# Patient Record
Sex: Female | Born: 1970 | Hispanic: No | Marital: Married | Smoking: Never smoker
Health system: Southern US, Community
[De-identification: ages and names within clinical notes are randomized; demographics above are authoritative.]

## PROBLEM LIST (undated history)

## (undated) DIAGNOSIS — R011 Cardiac murmur, unspecified: Secondary | ICD-10-CM

## (undated) DIAGNOSIS — R42 Dizziness and giddiness: Secondary | ICD-10-CM

## (undated) DIAGNOSIS — I10 Essential (primary) hypertension: Secondary | ICD-10-CM

## (undated) DIAGNOSIS — E785 Hyperlipidemia, unspecified: Secondary | ICD-10-CM

## (undated) HISTORY — DX: Cardiac murmur, unspecified: R01.1

---

## 2020-02-28 ENCOUNTER — Emergency Department (HOSPITAL_COMMUNITY)
Admission: EM | Admit: 2020-02-28 | Discharge: 2020-02-28 | Disposition: A | Discharge status: Home / Self Care | Payer: No Typology Code available for payment source | Attending: Emergency Medicine | Admitting: Emergency Medicine

## 2020-02-28 ENCOUNTER — Other Ambulatory Visit: Payer: Self-pay

## 2020-02-28 ENCOUNTER — Encounter (HOSPITAL_COMMUNITY): Payer: Self-pay | Admitting: Emergency Medicine

## 2020-02-28 ENCOUNTER — Emergency Department (HOSPITAL_COMMUNITY): Payer: No Typology Code available for payment source

## 2020-02-28 DIAGNOSIS — R519 Headache, unspecified: Secondary | ICD-10-CM | POA: Insufficient documentation

## 2020-02-28 DIAGNOSIS — M542 Cervicalgia: Secondary | ICD-10-CM | POA: Insufficient documentation

## 2020-02-28 DIAGNOSIS — R079 Chest pain, unspecified: Secondary | ICD-10-CM | POA: Diagnosis not present

## 2020-02-28 MED ORDER — CYCLOBENZAPRINE HCL 10 MG PO TABS: 10.0000 mg | ORAL_TABLET | Freq: Two times a day (BID) | ORAL | 0 refills | Status: DC | PRN

## 2020-02-28 MED ORDER — OXYCODONE-ACETAMINOPHEN 5-325 MG PO TABS
1.0000 | ORAL_TABLET | Freq: Once | ORAL | Status: AC
  Administered 2020-02-28: 1 via ORAL
  Filled 2020-02-28: qty 1

## 2020-02-28 MED ORDER — IBUPROFEN 600 MG PO TABS: 600.0000 mg | ORAL_TABLET | Freq: Four times a day (QID) | ORAL | 0 refills | Status: DC | PRN
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
## 2020-02-28 NOTE — ED Notes (Signed)
An After Visit Summary was printed and given to the patient. Discharge instructions given and no further questions at this time.  
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
## 2020-03-10 ENCOUNTER — Other Ambulatory Visit: Payer: Self-pay

## 2020-03-11 ENCOUNTER — Ambulatory Visit (INDEPENDENT_AMBULATORY_CARE_PROVIDER_SITE_OTHER): Payer: No Typology Code available for payment source | Admitting: Nurse Practitioner

## 2020-03-11 ENCOUNTER — Encounter: Payer: Self-pay | Admitting: Nurse Practitioner

## 2020-03-11 ENCOUNTER — Encounter (HOSPITAL_COMMUNITY): Payer: Self-pay | Admitting: Emergency Medicine

## 2020-03-11 VITALS — BP 120/82 | HR 100 | Temp 97.2°F | Ht 59.0 in | Wt 99.0 lb

## 2020-03-11 DIAGNOSIS — M545 Low back pain, unspecified: Secondary | ICD-10-CM

## 2020-03-11 DIAGNOSIS — F0781 Postconcussional syndrome: Secondary | ICD-10-CM

## 2020-03-11 NOTE — Patient Instructions (Addendum)
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   Post-Concussion Syndrome  Post-concussion syndrome is when symptoms last longer than normal after a head injury. What are the signs or symptoms? After a head injury, you may:  Have headaches.  Feel tired.  Feel dizzy.  Feel weak.  Have trouble seeing.  Have trouble in bright lights.  Have trouble hearing.  Not be able to remember things.  Not be able to focus.  Have trouble sleeping.  Have mood swings.  Have trouble learning new things. These can last from weeks to months. Follow these instructions at home: Medicines  Take all medicines only as told by your doctor.  Do not take prescription pain medicines. Activity  Limit activities as told by your doctor. This includes: ? Homework. ? Job-related work. ? Thinking. ? Watching TV. ? Using a computer or phone. ? Puzzles. ? Exercise. ? Sports.  Slowly return to your normal activity as told by your doctor.  Stop an activity if you have symptoms.  Do not do anything that may cause you to get injured again. General instructions  Rest. Try to: ? Sleep 7-9 hours each night. ? Take naps or breaks when you feel tired during the day.  Do not drink alcohol until your doctor says that you can.  Keep track of your symptoms.  Keep all follow-up visits as told by your doctor. This is important. Contact a doctor if:  You do not improve.  You get worse.  You have another injury. Get help right away if:  You have a very bad headache.  You feel confused.  You feel very sleepy.  You pass out (faint).  You throw up (vomit).  You feel weak in any part of your body.  You feel numb in any part of your body.  You start shaking (have a seizure).  You have trouble talking. Summary  Post-concussion syndrome is when symptoms last  longer than normal after a head injury.  Limit all activity after your injury. Gradually return to normal activity as told by your doctor.  Rest, do not drink alcohol, and avoid prescription pain medicines after a concussion.  Call your doctor if your symptoms get worse. This information is not intended to replace advice given to you by your health care provider. Make sure you discuss any questions you have with your health care provider. Document Revised: 03/09/2018 Document Reviewed: 06/21/2017 Elsevier Patient Education  2020 [employer]  Acute Back Pain, Adult Acute back pain is sudden and usually short-lived. It is often caused by an injury to the muscles and tissues in the back. The injury may result from:  A muscle or ligament getting overstretched or torn (strained). Ligaments are tissues that connect bones to each other. Lifting something improperly can cause a back strain.  Wear and tear (degeneration) of the spinal disks. Spinal disks are circular tissue that provides cushioning between the bones of the spine (vertebrae).  Twisting motions, such as while playing sports or doing yard work.  A hit to the back.  Arthritis. You may have a physical exam, lab tests, and imaging tests to find the cause of your pain. Acute back pain usually goes away with rest and home care. Follow these instructions at home: Managing pain, stiffness, and swelling  Take over-the-counter and prescription medicines only as told by your health care provider.  Your health  care provider may recommend applying ice during the first 24-48 hours after your pain starts. To do this: ? Put ice in a plastic bag. ? Place a towel between your skin and the bag. ? Leave the ice on for 20 minutes, 2-3 times a day.  If directed, apply heat to the affected area as often as told by your health care provider. Use the heat source that your health care provider recommends, such as a moist heat pack or a heating  pad. ? Place a towel between your skin and the heat source. ? Leave the heat on for 20-30 minutes. ? Remove the heat if your skin turns bright red. This is especially important if you are unable to feel pain, heat, or cold. You have a greater risk of getting burned. Activity   Do not stay in bed. Staying in bed for more than 1-2 days can delay your recovery.  Sit up and stand up straight. Avoid leaning forward when you sit, or hunching over when you stand. ? If you work at a desk, sit close to it so you do not need to lean over. Keep your chin tucked in. Keep your neck drawn back, and keep your elbows bent at a right angle. Your arms should look like the letter "L." ? Sit high and close to the steering wheel when you drive. Add lower back (lumbar) support to your car seat, if needed.  Take short walks on even surfaces as soon as you are able. Try to increase the length of time you walk each day.  Do not sit, drive, or stand in one place for more than 30 minutes at a time. Sitting or standing for long periods of time can put stress on your back.  Do not drive or use heavy machinery while taking prescription pain medicine.  Use proper lifting techniques. When you bend and lift, use positions that put less stress on your back: ? [place] your knees. ? Keep the load close to your body. ? Avoid twisting.  Exercise regularly as told by your health care provider. Exercising helps your back heal faster and helps prevent back injuries by keeping muscles strong and flexible.  Work with a physical therapist to make a safe exercise program, as recommended by your health care provider. Do any exercises as told by your physical therapist. Lifestyle  Maintain a healthy weight. Extra weight puts stress on your back and makes it difficult to have good posture.  Avoid activities or situations that make you feel anxious or stressed. Stress and anxiety increase muscle tension and can make back pain worse.  Learn ways to manage anxiety and stress, such as through exercise. General instructions  Sleep on a firm mattress in a comfortable position. Try lying on your side with your knees slightly bent. If you lie on your back, put a pillow under your knees.  Follow your treatment plan as told by your health care provider. This may include: ? Cognitive or behavioral therapy. ? Acupuncture or massage therapy. ? Meditation or yoga. Contact a health care provider if:  You have pain that is not relieved with rest or medicine.  You have increasing pain going down into your legs or buttocks.  Your pain does not improve after 2 weeks.  You have pain at night.  You lose weight without trying.  You have a fever or chills. Get help right away if:  You develop new bowel or bladder control problems.  You have unusual weakness  or numbness in your arms or legs.  You develop nausea or vomiting.  You develop abdominal pain.  You feel faint. Summary  Acute back pain is sudden and usually short-lived.  Use proper lifting techniques. When you bend and lift, use positions that put less stress on your back.  Take over-the-counter and prescription medicines and apply heat or ice as directed by your health care provider. This information is not intended to replace advice given to you by your health care provider. Make sure you discuss any questions you have with your health care provider. Document Revised: 09/05/2018 Document Reviewed: 12/29/2016 Elsevier Patient Education  2020 [employer].
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
## 2020-03-20 ENCOUNTER — Inpatient Hospital Stay: Payer: No Typology Code available for payment source | Admitting: Physician Assistant

## 2020-04-14 ENCOUNTER — Other Ambulatory Visit: Payer: Self-pay

## 2020-04-14 ENCOUNTER — Ambulatory Visit (INDEPENDENT_AMBULATORY_CARE_PROVIDER_SITE_OTHER): Payer: No Typology Code available for payment source

## 2020-04-14 ENCOUNTER — Ambulatory Visit (INDEPENDENT_AMBULATORY_CARE_PROVIDER_SITE_OTHER): Payer: No Typology Code available for payment source | Admitting: Nurse Practitioner

## 2020-04-14 ENCOUNTER — Encounter: Payer: Self-pay | Admitting: Nurse Practitioner

## 2020-04-14 VITALS — BP 138/82 | HR 82 | Temp 96.0°F | Ht 58.5 in | Wt 99.6 lb

## 2020-04-14 DIAGNOSIS — Z0000 Encounter for general adult medical examination without abnormal findings: Secondary | ICD-10-CM

## 2020-04-14 DIAGNOSIS — M545 Low back pain, unspecified: Secondary | ICD-10-CM

## 2020-04-14 DIAGNOSIS — Z136 Encounter for screening for cardiovascular disorders: Secondary | ICD-10-CM

## 2020-04-14 DIAGNOSIS — Z13220 Encounter for screening for lipoid disorders: Secondary | ICD-10-CM

## 2020-04-14 DIAGNOSIS — Z1231 Encounter for screening mammogram for malignant neoplasm of breast: Secondary | ICD-10-CM | POA: Diagnosis not present

## 2020-04-14 DIAGNOSIS — Z0001 Encounter for general adult medical examination with abnormal findings: Secondary | ICD-10-CM

## 2020-04-14 LAB — CBC WITH DIFFERENTIAL/PLATELET
Basophils Absolute: 0.1 10*3/uL (ref 0.0–0.1)
Basophils Relative: 1 % (ref 0.0–3.0)
Eosinophils Absolute: 0.1 10*3/uL (ref 0.0–0.7)
Eosinophils Relative: 1.4 % (ref 0.0–5.0)
HCT: 42.8 % (ref 36.0–46.0)
Hemoglobin: 14.1 g/dL (ref 12.0–15.0)
Lymphocytes Relative: 31.3 % (ref 12.0–46.0)
Lymphs Abs: 2.4 10*3/uL (ref 0.7–4.0)
MCHC: 32.9 g/dL (ref 30.0–36.0)
MCV: 82.7 fl (ref 78.0–100.0)
Monocytes Absolute: 0.5 10*3/uL (ref 0.1–1.0)
Monocytes Relative: 7.2 % (ref 3.0–12.0)
Neutro Abs: 4.4 10*3/uL (ref 1.4–7.7)
Neutrophils Relative %: 59.1 % (ref 43.0–77.0)
Platelets: 454 10*3/uL — ABNORMAL HIGH (ref 150.0–400.0)
RBC: 5.18 Mil/uL — ABNORMAL HIGH (ref 3.87–5.11)
RDW: 12.9 % (ref 11.5–15.5)
WBC: 7.5 10*3/uL (ref 4.0–10.5)

## 2020-04-14 LAB — COMPREHENSIVE METABOLIC PANEL
ALT: 14 U/L (ref 0–35)
AST: 17 U/L (ref 0–37)
Albumin: 4.5 g/dL (ref 3.5–5.2)
Alkaline Phosphatase: 76 U/L (ref 39–117)
BUN: 10 mg/dL (ref 6–23)
CO2: 32 mEq/L (ref 19–32)
Calcium: 9.5 mg/dL (ref 8.4–10.5)
Chloride: 102 mEq/L (ref 96–112)
Creatinine, Ser: 0.48 mg/dL (ref 0.40–1.20)
GFR: 112.59 mL/min (ref >60.00)
Glucose, Bld: 88 mg/dL (ref 70–99)
Potassium: 4.5 mEq/L (ref 3.5–5.1)
Sodium: 140 mEq/L (ref 135–145)
Total Bilirubin: 0.8 mg/dL (ref 0.2–1.2)
Total Protein: 7.6 g/dL (ref 6.0–8.3)

## 2020-04-14 LAB — LIPID PANEL
Cholesterol: 184 mg/dL (ref 0–200)
HDL: 60.7 mg/dL (ref >39.00)
LDL Cholesterol: 104 mg/dL — ABNORMAL HIGH (ref 0–99)
NonHDL: 123.77
Total CHOL/HDL Ratio: 3
Triglycerides: 98 mg/dL (ref 0.0–149.0)
VLDL: 19.6 mg/dL (ref 0.0–40.0)

## 2020-04-14 NOTE — Patient Instructions (Addendum)
Go to lab for blood draw and back x-ray  You will be contacted to schedule appt for mammogram  Health Maintenance, Female Adopting a healthy lifestyle and getting preventive care are important in promoting health and wellness. Ask your health care provider about:  The right schedule for you to have regular tests and exams.  Things you can do on your own to prevent diseases and keep yourself healthy. What should I know about diet, weight, and exercise? Eat a healthy diet   Eat a diet that includes plenty of vegetables, fruits, low-fat dairy products, and lean protein.  Do not eat a lot of foods that are high in solid fats, added sugars, or sodium. Maintain a healthy weight Body mass index (BMI) is used to identify weight problems. It estimates body fat based on height and weight. Your health care provider can help determine your BMI and help you achieve or maintain a healthy weight. Get regular exercise Get regular exercise. This is one of the most important things you can do for your health. Most adults should:  Exercise for at least 150 minutes each week. The exercise should increase your heart rate and make you sweat (moderate-intensity exercise).  Do strengthening exercises at least twice a week. This is in addition to the moderate-intensity exercise.  Spend less time sitting. Even light physical activity can be beneficial. Watch cholesterol and blood lipids Have your blood tested for lipids and cholesterol at 47 years of age, then have this test every 5 years. Have your cholesterol levels checked more often if:  Your lipid or cholesterol levels are high.  You are older than 47 years of age.  You are at high risk for heart disease. What should I know about cancer screening? Depending on your health history and family history, you may need to have cancer screening at various ages. This may include screening for:  Breast cancer.  Cervical cancer.  Colorectal  cancer.  Skin cancer.  Lung cancer. What should I know about heart disease, diabetes, and high blood pressure? Blood pressure and heart disease  High blood pressure causes heart disease and increases the risk of stroke. This is more likely to develop in people who have high blood pressure readings, are of African descent, or are overweight.  Have your blood pressure checked: ? Every 3-5 years if you are 14-23 years of age. ? Every year if you are 35 years old or older. Diabetes Have regular diabetes screenings. This checks your fasting blood sugar level. Have the screening done:  Once every three years after age 33 if you are at a normal weight and have a low risk for diabetes.  More often and at a younger age if you are overweight or have a high risk for diabetes. What should I know about preventing infection? Hepatitis B If you have a higher risk for hepatitis B, you should be screened for this virus. Talk with your health care provider to find out if you are at risk for hepatitis B infection. Hepatitis C Testing is recommended for:  Everyone born from [birth year] through 1963.  Anyone with known risk factors for hepatitis C. Sexually transmitted infections (STIs)  Get screened for STIs, including gonorrhea and chlamydia, if: ? You are sexually active and are younger than 47 years of age. ? You are older than 47 years of age and your health care provider tells you that you are at risk for this type of infection. ? Your sexual activity has changed since  you were last screened, and you are at increased risk for chlamydia or gonorrhea. Ask your health care provider if you are at risk.  Ask your health care provider about whether you are at high risk for HIV. Your health care provider may recommend a prescription medicine to help prevent HIV infection. If you choose to take medicine to prevent HIV, you should first get tested for HIV. You should then be tested every 3 months for as long as  you are taking the medicine. Pregnancy  If you are about to stop having your period (premenopausal) and you may become pregnant, seek counseling before you get pregnant.  Take 400 to 800 micrograms (mcg) of folic acid every day if you become pregnant.  Ask for birth control (contraception) if you want to prevent pregnancy. Osteoporosis and menopause Osteoporosis is a disease in which the bones lose minerals and strength with aging. This can result in bone fractures. If you are 47 years old or older, or if you are at risk for osteoporosis and fractures, ask your health care provider if you should:  Be screened for bone loss.  Take a calcium or vitamin D supplement to lower your risk of fractures.  Be given hormone replacement therapy (HRT) to treat symptoms of menopause. Follow these instructions at home: Lifestyle  Do not use any products that contain nicotine or tobacco, such as cigarettes, e-cigarettes, and chewing tobacco. If you need help quitting, ask your health care provider.  Do not use street drugs.  Do not share needles.  Ask your health care provider for help if you need support or information about quitting drugs. Alcohol use  Do not drink alcohol if: ? Your health care provider tells you not to drink. ? You are pregnant, may be pregnant, or are planning to become pregnant.  If you drink alcohol: ? Limit how much you use to 0-1 drink a day. ? Limit intake if you are breastfeeding.  Be aware of how much alcohol is in your drink. In the [place] one drink equals one 12 oz bottle of beer (355 mL), one 5 oz glass of wine (148 mL), or one 1 oz glass of hard liquor (44 mL). General instructions  Schedule regular health, dental, and eye exams.  Stay current with your vaccines.  Tell your health care provider if: ? You often feel depressed. ? You have ever been abused or do not feel safe at home. Summary  Adopting a healthy lifestyle and getting preventive care are  important in promoting health and wellness.  Follow your health care provider's instructions about healthy diet, exercising, and getting tested or screened for diseases.  Follow your health care provider's instructions on monitoring your cholesterol and blood pressure. This information is not intended to replace advice given to you by your health care provider. Make sure you discuss any questions you have with your health care provider. Document Revised: 05/10/2018 Document Reviewed: 05/10/2018 Elsevier Patient Education  2020 [employer].
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
## 2020-06-17 ENCOUNTER — Encounter (HOSPITAL_COMMUNITY): Payer: Self-pay

## 2020-06-17 ENCOUNTER — Other Ambulatory Visit: Payer: Self-pay

## 2020-06-17 DIAGNOSIS — W01198A Fall on same level from slipping, tripping and stumbling with subsequent striking against other object, initial encounter: Secondary | ICD-10-CM | POA: Diagnosis not present

## 2020-06-17 DIAGNOSIS — S0990XA Unspecified injury of head, initial encounter: Secondary | ICD-10-CM | POA: Diagnosis present
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
## 2020-06-18 ENCOUNTER — Emergency Department (HOSPITAL_COMMUNITY)
Admission: EM | Admit: 2020-06-18 | Discharge: 2020-06-18 | Disposition: A | Discharge status: Home / Self Care | Payer: No Typology Code available for payment source | Attending: Emergency Medicine | Admitting: Emergency Medicine

## 2020-06-18 DIAGNOSIS — W19XXXA Unspecified fall, initial encounter: Secondary | ICD-10-CM

## 2020-06-18 DIAGNOSIS — S0990XA Unspecified injury of head, initial encounter: Secondary | ICD-10-CM

## 2020-06-18 MED ORDER — ACETAMINOPHEN 500 MG PO TABS
1000.0000 mg | ORAL_TABLET | Freq: Once | ORAL | Status: AC
  Administered 2020-06-18: 1000 mg via ORAL
  Filled 2020-06-18: qty 2

## 2020-06-18 MED ORDER — IBUPROFEN 600 MG PO TABS: 600.0000 mg | ORAL_TABLET | Freq: Four times a day (QID) | ORAL | 0 refills | Status: AC | PRN

## 2020-06-18 MED ORDER — CYCLOBENZAPRINE HCL 10 MG PO TABS: 10.0000 mg | ORAL_TABLET | Freq: Two times a day (BID) | ORAL | 0 refills | Status: DC | PRN

## 2020-06-18 NOTE — ED Notes (Signed)
Verbalized understanding discharge instructions, prescriptions, and follow-up. In no acute distress.   

## 2020-06-18 NOTE — ED Provider Notes (Signed)
[facility]-EMERGENCY DEPT Provider Note   CSN: [id number] Arrival date & time: 06/17/20  1918     History Chief Complaint  Patient presents with  . Fall    Mia King is a 50 y.o. female.  The history is provided by the patient. No language interpreter was used.  Fall     50 year old female presenting to ED for evaluation of a recent fall.  Patient [place], slipped on ice fell backwards and struck her head.  She denies any loss of consciousness but states she was on the ground for several minutes requiring help to get up.  She complained of sharp pain to the back of her head, moderate in severity nonradiating worse with palpation.  She also endorsed neck pain as well.  She does not complain of confusion, nausea, vomiting, light sensitivity, focal numbness or focal weakness.  She denies any precipitating symptoms prior to the fall.  She is not on any blood thinner medication.  History reviewed. No pertinent past medical history.  Patient Active Problem List   Diagnosis Date Noted  . Acute bilateral low back pain without sciatica 04/14/2020    History reviewed. No pertinent surgical history.   OB History   No obstetric history on file.     No family history on file.  Social History   Tobacco Use  . Smoking status: Never Smoker  . Smokeless tobacco: Never Used  Vaping Use  . Vaping Use: Never used  Substance Use Topics  . Alcohol use: Never  . Drug use: Never    Home Medications Prior to Admission medications   Medication Sig Start Date End Date Taking? Authorizing Provider  cyclobenzaprine (FLEXERIL) 10 MG tablet Take 1 tablet (10 mg total) by mouth 2 (two) times daily as needed for muscle spasms. 02/28/20   Sponseller, Lupe Carney R, PA-C  ibuprofen (ADVIL) 600 MG tablet Take 1 tablet (600 mg total) by mouth every 6 (six) hours as needed for mild pain. 02/28/20   Pollyann Savoy, MD     Allergies    Eggs or egg-derived products and Other  Review of Systems   Review of Systems  All other systems reviewed and are negative.   Physical Exam Updated Vital Signs BP (!) 122/52 (BP Location: Left Arm)   Pulse 76   Temp 98.1 F (36.7 C) (Oral)   Resp 16   Ht 4\' 11"  (1.499 m)   Wt 40.8 kg   LMP 06/10/2020   SpO2 97%   BMI 18.18 kg/m   Physical Exam Vitals and nursing note reviewed.  Constitutional:      General: She is not in acute distress.    Appearance: She is well-developed and well-nourished.     Comments: Patient sitting comfortably in the chair appears to be in no acute discomfort  HENT:     Head: Normocephalic and atraumatic.     Comments: Mild tenderness to the occipital scalp without any ecchymosis laceration or overlying skin changes.  No hemotympanum no septal hematoma no malocclusion no midface tenderness no battle signs or raccoon's eyes. Eyes:     Extraocular Movements: Extraocular movements intact.     Conjunctiva/sclera: Conjunctivae normal.     Pupils: Pupils are equal, round, and reactive to light.  Musculoskeletal:     Cervical back: Normal range of motion and neck supple. Tenderness (Mild tenderness to cervical and paracervical spinal muscle with normal range of motion.) present. No rigidity.  Skin:    Findings: No rash.  Neurological:     Mental Status: She is alert and oriented to person, place, and time.     GCS: GCS eye subscore is 4. GCS verbal subscore is 5. GCS motor subscore is 6.     Cranial Nerves: Cranial nerves are intact.     Sensory: Sensation is intact.     Motor: Motor function is intact.     Gait: Gait is intact.  Psychiatric:        Mood and Affect: Mood and affect and mood normal.     ED Results / Procedures / Treatments   Labs (all labs ordered are listed, but only abnormal results are displayed) Labs Reviewed - No data to display  EKG None  Radiology No results found.  Procedures Procedures  (including critical care time)  Medications Ordered in ED Medications  acetaminophen (TYLENOL) tablet 1,000 mg (has no administration in time range)    ED Course  I have reviewed the triage vital signs and the nursing notes.  Pertinent labs & imaging results that were available during my care of the patient were reviewed by me and considered in my medical decision making (see chart for details).    MDM Rules/Calculators/A&P                          BP (!) 122/52 (BP Location: Left Arm)   Pulse 76   Temp 98.1 F (36.7 C) (Oral)   Resp 16   Ht 4\' 11"  (1.499 m)   Wt 40.8 kg   LMP 06/10/2020   SpO2 97%   BMI 18.18 kg/m   Final Clinical Impression(s) / ED Diagnoses Final diagnoses:  Minor head injury, initial encounter  Fall, initial encounter    Rx / DC Orders ED Discharge Orders         Ordered    cyclobenzaprine (FLEXERIL) 10 MG tablet  2 times daily PRN        06/18/20 1010    ibuprofen (ADVIL) 600 MG tablet  Every 6 hours PRN        06/18/20 1010         10:05 AM Patient had a mechanical fall yesterday when she slipped on ice and struck her head.  Minor head injury without obvious signs of trauma on exam.  She is mentating appropriately, normal gait on ambulation and without any focal neurodeficit.  Through shared decision making, we opted not to have advanced imaging as it is no acute.  Will provide symptomatic treatment and give return precaution.  Work note provided as requested.   06/20/20, PA-C 06/18/20 1012    06/20/20, MD 06/18/20 [id number]

## 2020-06-18 NOTE — Discharge Instructions (Signed)
You have been evaluated for your recent fall.  Fortunately no obvious significant sign of head trauma on exam.  Take ibuprofen and/or flexeril as needed for your symptoms.  If you develop nausea/vomiting , worsening headache, or if you have any concerns please do not hesitate to return to the ER for reevaluation.

## 2021-05-27 ENCOUNTER — Ambulatory Visit: Payer: Self-pay | Admitting: Cardiology

## 2021-05-27 DIAGNOSIS — R011 Cardiac murmur, unspecified: Secondary | ICD-10-CM | POA: Insufficient documentation

## 2021-05-27 NOTE — Progress Notes (Signed)
No show

## 2021-09-10 ENCOUNTER — Encounter (HOSPITAL_COMMUNITY): Payer: Self-pay

## 2021-09-10 ENCOUNTER — Ambulatory Visit (HOSPITAL_COMMUNITY)
Admission: EM | Admit: 2021-09-10 | Discharge: 2021-09-10 | Disposition: A | Discharge status: Home / Self Care | Payer: No Typology Code available for payment source | Attending: Emergency Medicine | Admitting: Emergency Medicine

## 2021-09-10 DIAGNOSIS — R42 Dizziness and giddiness: Secondary | ICD-10-CM

## 2021-09-10 DIAGNOSIS — R110 Nausea: Secondary | ICD-10-CM

## 2021-09-10 MED ORDER — ONDANSETRON HCL 4 MG PO TABS: 4.0000 mg | ORAL_TABLET | Freq: Three times a day (TID) | ORAL | 0 refills | Status: DC | PRN

## 2021-09-10 MED ORDER — ONDANSETRON HCL 4 MG/2ML IJ SOLN
4.0000 mg | Freq: Once | INTRAMUSCULAR | Status: AC
  Administered 2021-09-10: 4 mg via INTRAMUSCULAR

## 2021-09-10 MED ORDER — ONDANSETRON HCL 4 MG/2ML IJ SOLN
INTRAMUSCULAR | Status: AC
  Filled 2021-09-10: qty 2

## 2021-09-10 MED ORDER — MECLIZINE HCL 25 MG PO TABS: 25.0000 mg | ORAL_TABLET | Freq: Three times a day (TID) | ORAL | 0 refills | Status: DC | PRN

## 2021-09-10 MED ORDER — MECLIZINE HCL 25 MG PO TABS: 25.0000 mg | ORAL_TABLET | Freq: Once | ORAL | Status: DC

## 2021-09-10 NOTE — Discharge Instructions (Addendum)
Your symptoms and physical exam are consistent with vertigo.  Take meclizine and antinausea medication as needed 3 times daily.  Return for new or worsening symptoms at any time for reevaluation.  It may take 24 hours for your symptoms to improve. ?

## 2021-09-10 NOTE — ED Provider Notes (Addendum)
?[facility] ? ? ?MRN: [id number] DOB: [birth year] ? ?Subjective:  ? ?Chief Complaint;  ?Chief Complaint  ?Patient presents with  ? Chest Pain  ? Dizziness  ?Pt presents today with chest pain, dizziness, and vomiting that started this morning. Pt describes pressure in chest pain. Pt denies chest pain radiating anywhere else.Pt states difficulty breathing.  ? ?Baylei Siebels is a 51 y.o. female presenting for severe dizziness that started this morning which she describes as a spinning sensation causing secondary dizziness.  She also describes intermittent pain in her chest.  She has no significant risk factors for CAD and no family history. ? ? ?Current Facility-Administered Medications:  ?  meclizine (ANTIVERT) tablet 25 mg, 25 mg, Oral, Once, Dewaine Conger L, NP ?  ondansetron (ZOFRAN) injection 4 mg, 4 mg, Intramuscular, Once, Jone Baseman, NP ? ?Current Outpatient Medications:  ?  meclizine (ANTIVERT) 25 MG tablet, Take 1 tablet (25 mg total) by mouth 3 (three) times daily as needed for dizziness., Disp: 30 tablet, Rfl: 0 ?  ondansetron (ZOFRAN) 4 MG tablet, Take 1 tablet (4 mg total) by mouth every 8 (eight) hours as needed for nausea or vomiting., Disp: 4 tablet, Rfl: 0 ?  cyclobenzaprine (FLEXERIL) 10 MG tablet, Take 1 tablet (10 mg total) by mouth 2 (two) times daily as needed for muscle spasms., Disp: 15 tablet, Rfl: 0 ?  ibuprofen (ADVIL) 600 MG tablet, Take 1 tablet (600 mg total) by mouth every 6 (six) hours as needed for mild pain or moderate pain., Disp: 20 tablet, Rfl: 0  ? ?Allergies  ?Allergen Reactions  ? Eggs Or Egg-Derived Products Hives  ? Other Hives  ?  Chicken and Seafood  ? ? ?History reviewed. No pertinent past medical history.  ? ?Review of Systems  ?All other systems reviewed and are negative. ? ? ?Objective:  ? ?Vitals: ?BP 118/74 (BP Location: Right Arm)   Pulse 97   Temp 98 ?F (36.7 ?C) (Oral)   Resp 18   LMP 08/10/2021   SpO2 98%  ? ?Physical Exam ?Vitals and  nursing note reviewed.  ?Constitutional:   ?   General: She is in acute distress.  ?   Appearance: Normal appearance. She is not ill-appearing or diaphoretic.  ?   Comments: Patient appears uncomfortable she is slouched over in a wheelchair with her eyes closed.  ?HENT:  ?   Head: Atraumatic.  ?   Right Ear: External ear normal.  ?   Left Ear: External ear normal.  ?   Nose: Nose normal.  ?   Mouth/Throat:  ?   Mouth: Mucous membranes are moist.  ?Eyes:  ?   General: No scleral icterus.    ?   Right eye: No discharge.     ?   Left eye: No discharge.  ?   Extraocular Movements: Extraocular movements intact.  ?   Conjunctiva/sclera: Conjunctivae normal.  ?   Pupils: Pupils are equal, round, and reactive to light.  ?   Comments: Mild nystagmus on EOM unidirectional horizontal  ?Cardiovascular:  ?   Rate and Rhythm: Normal rate and regular rhythm.  ?Pulmonary:  ?   Effort: Pulmonary effort is normal.  ?   Breath sounds: Normal breath sounds.  ?Abdominal:  ?   General: There is no distension.  ?   Palpations: Abdomen is soft.  ?   Tenderness: There is no abdominal tenderness. There is no right CVA tenderness, left CVA tenderness, guarding or  rebound.  ?Skin: ?   General: Skin is warm and dry.  ?   Capillary Refill: Capillary refill takes less than 2 seconds.  ?Neurological:  ?   General: No focal deficit present.  ?   Mental Status: She is alert and oriented to person, place, and time.  ?Psychiatric:     ?   Mood and Affect: Mood normal.  ? ? ?No results found for this or any previous visit (from the past 24 hour(s)). ? ?No results found.  ?  ? ?Assessment and Plan :  ? ?1. Vertigo   ? ? ?Meds ordered this encounter  ?Medications  ? meclizine (ANTIVERT) tablet 25 mg  ? ondansetron (ZOFRAN) injection 4 mg  ? meclizine (ANTIVERT) 25 MG tablet  ?  Sig: Take 1 tablet (25 mg total) by mouth 3 (three) times daily as needed for dizziness.  ?  Dispense:  30 tablet  ?  Refill:  0  ? ondansetron (ZOFRAN) 4 MG tablet  ?  Sig: Take  1 tablet (4 mg total) by mouth every 8 (eight) hours as needed for nausea or vomiting.  ?  Dispense:  4 tablet  ?  Refill:  0  ? ? ?MDM:  ?Shonya Sumida is a 51 y.o. female presenting for spinning dizziness that started this morning was secondary nausea and a few bouts of retching without significant vomiting.  Her clinical exam and vital signs are unremarkable, no neurologic deficits.  Patient is started here in the clinic on meclizine and Zofran she is encouraged to rest take her medication and return at any time if new or worsening symptoms ? ?Dewaine Conger FNP-C MCN  ?  ?Jone Baseman, NP ?09/10/21 [phone] ? ?Pt EKG RSR without st abnormalities- CP was reproduced on AP and lateral wall compression ?  ?Jone Baseman, NP ?09/10/21 [phone] ? ?

## 2021-09-10 NOTE — ED Triage Notes (Signed)
Pt presents today with chest pain, dizziness, and vomiting that started this morning. Pt describes pressure in chest pain. Pt denies chest pain radiating anywhere else.Pt states difficulty breathing.  ?

## 2022-04-13 ENCOUNTER — Encounter: Payer: Self-pay | Admitting: Internal Medicine

## 2022-04-13 ENCOUNTER — Ambulatory Visit: Payer: No Typology Code available for payment source | Admitting: Internal Medicine

## 2022-04-13 VITALS — BP 132/82 | HR 91 | Resp 16 | Ht 59.0 in | Wt 99.0 lb

## 2022-04-13 DIAGNOSIS — I517 Cardiomegaly: Secondary | ICD-10-CM

## 2022-04-13 DIAGNOSIS — R072 Precordial pain: Secondary | ICD-10-CM

## 2022-04-13 DIAGNOSIS — I10 Essential (primary) hypertension: Secondary | ICD-10-CM

## 2022-04-13 NOTE — Progress Notes (Signed)
Primary Physician/Referring:  Anne Ng, NP  Patient ID: Mia King, female    DOB: Mar 02, 1971, 51 y.o.   MRN: [id number]  Chief Complaint  Patient presents with   Chest Pain   New Patient (Initial Visit)   HPI:    Mia King  is a 51 y.o. female with past medical history significant for hypertension who is here to establish care with cardiology.  Patient has been told a few times that she has a heart murmur, however nothing was needed to be done about this.  Patient does not recall if she ever had an echocardiogram in the past.  She does not have any other cardiac history that she knows of.  Heart disease and high blood pressure do run in her family though.  Patient's blood pressure is currently well controlled on very low-dose amlodipine.  Patient does not smoke or drink alcohol.  She denies chest pain, shortness of breath, diaphoresis, syncope, orthopnea, edema, PND, claudication.  Patient would like to be called regarding the results of her echocardiogram rather than come back for another visit.  She will follow with [place] on an as-needed basis.  History reviewed. No pertinent past medical history. History reviewed. No pertinent surgical history. History reviewed. No pertinent family history.  Social History   Tobacco Use   Smoking status: Never   Smokeless tobacco: Never  Substance Use Topics   Alcohol use: Yes    Comment: occ   Marital Status: Married  ROS  Review of Systems  Cardiovascular:  Positive for chest pain and palpitations.   Objective  Blood pressure 132/82, pulse 91, resp. rate 16, height 4\' 11"  (1.499 m), weight 99 lb (44.9 kg), SpO2 99 %. Body mass index is 20 kg/m.     04/13/2022   12:23 PM 09/10/2021    9:21 AM 06/18/2020    9:16 AM  Vitals with BMI  Height 4\' 11"     Weight 99 lbs    BMI 19.98    Systolic 132 118 06/20/2020  Diastolic 82 74 52  Pulse 91 97 76    Physical Exam Vitals reviewed.  HENT:     Head: Normocephalic and atraumatic.   Cardiovascular:     Rate and Rhythm: Normal rate and regular rhythm.     Pulses: Normal pulses.     Heart sounds: Normal heart sounds. No murmur heard. Pulmonary:     Effort: Pulmonary effort is normal.     Breath sounds: Normal breath sounds.  Abdominal:     General: Bowel sounds are normal.  Musculoskeletal:     Right lower leg: No edema.     Left lower leg: No edema.  Skin:    General: Skin is warm and dry.  Neurological:     Mental Status: She is alert.     Medications and allergies   Allergies  Allergen Reactions   Eggs Or Egg-Derived Products Hives   Other Hives    Chicken and Seafood     Medication list after today's encounter   Current Outpatient Medications:    amLODipine (NORVASC) 2.5 MG tablet, Take 2.5 mg by mouth daily., Disp: , Rfl:    ibuprofen (ADVIL) 600 MG tablet, Take 1 tablet (600 mg total) by mouth every 6 (six) hours as needed for mild pain or moderate pain., Disp: 20 tablet, Rfl: 0   triamcinolone cream (KENALOG) 0.1 %, Apply 1 Application topically daily at 6 (six) AM., Disp: , Rfl:   Laboratory examination:   Lab  Results  Component Value Date   NA 140 04/14/2020   K 4.5 04/14/2020   CO2 32 04/14/2020   GLUCOSE 88 04/14/2020   BUN 10 04/14/2020   CREATININE 0.48 04/14/2020   CALCIUM 9.5 04/14/2020       Latest Ref Rng & Units 04/14/2020    8:53 AM  CMP  Glucose 70 - 99 mg/dL 88   BUN 6 - 23 mg/dL 10   Creatinine 0.40 - 1.20 mg/dL 0.48   Sodium 135 - 145 mEq/L 140   Potassium 3.5 - 5.1 mEq/L 4.5   Chloride 96 - 112 mEq/L 102   CO2 19 - 32 mEq/L 32   Calcium 8.4 - 10.5 mg/dL 9.5   Total Protein 6.0 - 8.3 g/dL 7.6   Total Bilirubin 0.2 - 1.2 mg/dL 0.8   Alkaline Phos 39 - 117 U/L 76   AST 0 - 37 U/L 17   ALT 0 - 35 U/L 14       Latest Ref Rng & Units 04/14/2020    8:53 AM  CBC  WBC 4.0 - 10.5 K/uL 7.5   Hemoglobin 12.0 - 15.0 g/dL 14.1   Hematocrit 36.0 - 46.0 % 42.8   Platelets 150.0 - 400.0 K/uL 454.0     Lipid  Panel No results for input(s): "CHOL", "TRIG", "LDLCALC", "VLDL", "HDL", "CHOLHDL", "LDLDIRECT" in the last 8760 hours.  HEMOGLOBIN A1C No results found for: "HGBA1C", "MPG" TSH No results for input(s): "TSH" in the last 8760 hours.  External labs:     Radiology:    Cardiac Studies:   No results found for this or any previous visit from the past 1095 days.     No results found for this or any previous visit from the past 1095 days.   EKG:   04/13/2022: NSR, LAE, normal axis, normal R wave progression  Assessment     ICD-10-CM   1. Precordial pain  R07.2 EKG 12-Lead    PCV ECHOCARDIOGRAM COMPLETE    2. Essential hypertension  I10 PCV ECHOCARDIOGRAM COMPLETE    3. LAE (left atrial enlargement)  I51.7 PCV ECHOCARDIOGRAM COMPLETE       Orders Placed This Encounter  Procedures   EKG 12-Lead   PCV ECHOCARDIOGRAM COMPLETE    Standing Status:   Future    Standing Expiration Date:   04/14/2023    No orders of the defined types were placed in this encounter.   Medications Discontinued During This Encounter  Medication Reason   meclizine (ANTIVERT) 25 MG tablet    ondansetron (ZOFRAN) 4 MG tablet    cyclobenzaprine (FLEXERIL) 10 MG tablet      Recommendations:   Mia King is a 51 y.o.  F with HTN    Essential hypertension Continue current cardiac medications. Encourage low-sodium diet, less than 2000 mg daily.    LAE (left atrial enlargement) Echocardiogram ordered  Follow-up as needed Patient would prefer to be called regarding echo results     Mia Flock, DO, [facility]  04/13/2022, 12:57 PM Office: [phone] Pager: [phone]

## 2022-04-21 ENCOUNTER — Other Ambulatory Visit: Payer: No Typology Code available for payment source

## 2022-04-29 ENCOUNTER — Ambulatory Visit: Payer: No Typology Code available for payment source

## 2022-04-29 DIAGNOSIS — I517 Cardiomegaly: Secondary | ICD-10-CM

## 2022-04-29 DIAGNOSIS — I10 Essential (primary) hypertension: Secondary | ICD-10-CM

## 2022-04-29 DIAGNOSIS — R072 Precordial pain: Secondary | ICD-10-CM

## 2022-05-12 ENCOUNTER — Telehealth: Payer: Self-pay

## 2022-05-12 NOTE — Telephone Encounter (Signed)
normal

## 2022-05-12 NOTE — Telephone Encounter (Signed)
Patient calling for echo results 
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
## 2022-09-16 ENCOUNTER — Other Ambulatory Visit: Payer: Self-pay

## 2022-09-16 ENCOUNTER — Encounter (HOSPITAL_COMMUNITY): Payer: Self-pay

## 2022-09-16 ENCOUNTER — Emergency Department (HOSPITAL_COMMUNITY): Payer: No Typology Code available for payment source

## 2022-09-16 ENCOUNTER — Emergency Department (HOSPITAL_COMMUNITY)
Admission: EM | Admit: 2022-09-16 | Discharge: 2022-09-16 | Disposition: A | Discharge status: Home / Self Care | Payer: No Typology Code available for payment source | Attending: Emergency Medicine | Admitting: Emergency Medicine

## 2022-09-16 DIAGNOSIS — R0789 Other chest pain: Secondary | ICD-10-CM | POA: Insufficient documentation

## 2022-09-16 DIAGNOSIS — Z79899 Other long term (current) drug therapy: Secondary | ICD-10-CM | POA: Insufficient documentation

## 2022-09-16 DIAGNOSIS — I10 Essential (primary) hypertension: Secondary | ICD-10-CM | POA: Insufficient documentation

## 2022-09-16 HISTORY — DX: Essential (primary) hypertension: I10

## 2022-09-16 HISTORY — DX: Hyperlipidemia, unspecified: E78.5

## 2022-09-16 HISTORY — DX: Dizziness and giddiness: R42

## 2022-09-16 LAB — BASIC METABOLIC PANEL
Anion gap: 10 (ref 5–15)
BUN: 9 mg/dL (ref 6–20)
CO2: 24 mmol/L (ref 22–32)
Calcium: 9.2 mg/dL (ref 8.9–10.3)
Chloride: 102 mmol/L (ref 98–111)
Creatinine, Ser: 0.53 mg/dL (ref 0.44–1.00)
GFR, Estimated: >60 mL/min (ref >60)
Glucose, Bld: 104 mg/dL — ABNORMAL HIGH (ref 70–99)
Potassium: 3.6 mmol/L (ref 3.5–5.1)
Sodium: 136 mmol/L (ref 135–145)

## 2022-09-16 LAB — CBC
HCT: 40.9 % (ref 36.0–46.0)
Hemoglobin: 13.8 g/dL (ref 12.0–15.0)
MCH: 27.8 pg (ref 26.0–34.0)
MCHC: 33.7 g/dL (ref 30.0–36.0)
MCV: 82.3 fL (ref 80.0–100.0)
Platelets: 498 10*3/uL — ABNORMAL HIGH (ref 150–400)
RBC: 4.97 MIL/uL (ref 3.87–5.11)
RDW: 12.5 % (ref 11.5–15.5)
WBC: 8.1 10*3/uL (ref 4.0–10.5)
nRBC: 0 % (ref 0.0–0.2)

## 2022-09-16 LAB — TROPONIN I (HIGH SENSITIVITY): Troponin I (High Sensitivity): <2 ng/L (ref <18)

## 2022-09-16 MED ORDER — ACETAMINOPHEN 500 MG PO TABS
1000.0000 mg | ORAL_TABLET | Freq: Once | ORAL | Status: AC
  Administered 2022-09-16: 1000 mg via ORAL
  Filled 2022-09-16: qty 2

## 2022-09-16 MED ORDER — IBUPROFEN 400 MG PO TABS
600.0000 mg | ORAL_TABLET | Freq: Once | ORAL | Status: AC
  Administered 2022-09-16: 600 mg via ORAL
  Filled 2022-09-16: qty 1

## 2022-09-16 NOTE — ED Provider Triage Note (Signed)
Emergency Medicine Provider Triage Evaluation Note  Mia King , a 52 y.o. female  was evaluated in triage.  Pt complains of chest pain.  Started 3 days ago.  It is in the left chest and feels like "poking".  It radiates to her back at times.  Endorses associated shortness of breath.  Is nonreproducible.  Patient has known heart murmur.  States she was doing things around the house when it started.  Unsure if it is worse with exertion.  Review of Systems  Positive: See above Negative: See above  Physical Exam  BP (!) 142/82   Pulse 98   Temp (!) 97 F (36.1 C)   Resp 15   Ht  (1.499 m)   Wt 42.9 kg   SpO2 100%   BMI 19.11 kg/m  Gen:   Awake, no distress   Resp:  Normal effort  MSK:   Moves extremities without difficulty  Other:    Medical Decision Making  Medically screening exam initiated at 12:19 PM.  Appropriate orders placed.  Deborrah Mabin was informed that the remainder of the evaluation will be completed by another provider, this initial triage assessment does not replace that evaluation, and the importance of remaining in the ED until their evaluation is complete.  Work up started   Gareth Eagle, PA-C 09/16/22 1240

## 2022-09-16 NOTE — ED Provider Notes (Signed)
[place] [facility]PART[facility] AT [facility] Provider Note   CSN: [id number] Arrival date & time: 09/16/22  1138     History  Chief Complaint  Patient presents with   Chest Pain    Mia King is a 52 y.o. female.  Patient is a 52 year old female with a past medical history of hypertension presenting to the emergency department with chest pain.  Patient states for the last 3 days she has had intermittent sharp stabbing pain in the left side of her chest.  She states that it will occasionally feel it in her back.  She states that it last for seconds at a time.  She states that it will come on randomly and does not seem to be associated with exertion or eating.  She states that it was happening more frequently so she went to her primary doctor today who recommended that she come to the emergency department.  She did receive aspirin and nitro prior to arrival here and reported some improvement of the pain but states that the pain has still been recurrent.  She states that she has had no recent fever or cough, lower extremity swelling, history of blood clots, recent hospitalizations or surgery, recent long travel in the car or plane or hormone use.  She states that she does lift a lot of heavy boxes at work.  The history is provided by the patient.  Chest Pain      Home Medications Prior to Admission medications   Medication Sig Start Date End Date Taking? Authorizing Provider  amLODipine (NORVASC) 2.5 MG tablet Take 2.5 mg by mouth daily. 03/31/22   [provider]  ibuprofen (ADVIL) 600 MG tablet Take 1 tablet (600 mg total) by mouth every 6 (six) hours as needed for mild pain or moderate pain. 06/18/20   Fayrene Helper, PA-C  triamcinolone cream (KENALOG) 0.1 % Apply 1 Application topically daily at 6 (six) AM. 03/20/22   [provider]      Allergies    Egg-derived products and Other    Review of Systems   Review of Systems  Cardiovascular:  Positive  for chest pain.    Physical Exam Updated Vital Signs BP 123/80 (BP Location: Left Arm)   Pulse 94   Temp 98.2 F (36.8 C) (Oral)   Resp 18   Ht 4\' 11"  (1.499 m)   Wt 42.9 kg   SpO2 100%   BMI 19.11 kg/m  Physical Exam Vitals and nursing note reviewed.  Constitutional:      General: She is not in acute distress.    Appearance: She is well-developed.  HENT:     Head: Normocephalic and atraumatic.  Eyes:     Extraocular Movements: Extraocular movements intact.  Cardiovascular:     Rate and Rhythm: Normal rate and regular rhythm.     Pulses:          Radial pulses are 2+ on the right side and 2+ on the left side.       Dorsalis pedis pulses are 2+ on the right side and 2+ on the left side.     Heart sounds: Normal heart sounds.  Pulmonary:     Effort: Pulmonary effort is normal.     Breath sounds: Normal breath sounds.  Chest:     Chest wall: Tenderness (Left-sided chest wall) present.  Abdominal:     Palpations: Abdomen is soft.     Tenderness: There is no abdominal tenderness.  Musculoskeletal:  General: Normal range of motion.     Cervical back: Normal range of motion and neck supple.     Right lower leg: No edema.     Left lower leg: No edema.  Skin:    General: Skin is warm and dry.  Neurological:     General: No focal deficit present.     Mental Status: She is alert and oriented to person, place, and time.  Psychiatric:        Mood and Affect: Mood normal.        Behavior: Behavior normal.     ED Results / Procedures / Treatments   Labs (all labs ordered are listed, but only abnormal results are displayed) Labs Reviewed  BASIC METABOLIC PANEL - Abnormal; Notable for the following components:      Result Value   Glucose, Bld 104 (*)    All other components within normal limits  CBC - Abnormal; Notable for the following components:   Platelets 498 (*)    All other components within normal limits  TROPONIN I (HIGH SENSITIVITY)    EKG EKG  Interpretation  Date/Time:  Thursday September 16 2022 11:39:41 EDT Ventricular Rate:  98 PR Interval:  134 QRS Duration: 84 QT Interval:  340 QTC Calculation: 434 R Axis:   84 Text Interpretation: Normal sinus rhythm T wave abnormality, consider inferolateral ischemia Abnormal ECG When compared with ECG of 10-Sep-2021 09:49, PREVIOUS ECG IS PRESENT Confirmed by Elayne Snare ([id number]) on 09/16/2022 5:40:37 PM  Radiology DG Chest 1 View  Result Date: 09/16/2022 CLINICAL DATA:  Chest pain EXAM: CHEST  1 VIEW COMPARISON:  Two-view chest x-ray 02/28/2020 FINDINGS: Heart size is normal. The lungs are clear. The visualized soft tissues and bony thorax are unremarkable. IMPRESSION: No active disease. Electronically Signed   By: Marin Roberts M.D.   On: 09/16/2022 13:21    Procedures Procedures    Medications Ordered in ED Medications  acetaminophen (TYLENOL) tablet 1,000 mg (has no administration in time range)  ibuprofen (ADVIL) tablet 600 mg (has no administration in time range)    ED Course/ Medical Decision Making/ A&P                             Medical Decision Making This patient presents to the ED with chief complaint(s) of chest pain with pertinent past medical history of hypertension which further complicates the presenting complaint. The complaint involves an extensive differential diagnosis and also carries with it a high risk of complications and morbidity.    The differential diagnosis includes ACS, arrhythmia, anemia, pneumonia, pneumothorax, pulmonary edema, pleural effusion, gastritis, GERD, costochondritis, chest wall pain, PE unlikely show he is low risk by Wells criteria  Additional history obtained: Additional history obtained from N/A Records reviewed Primary Care Documents and outpatient cardiology records-recent normal echo  ED Course and Reassessment: On patient's arrival to the emergency department she was initially evaluated by provider in triage and  had EKG, labs and chest x-ray performed.  Patient's EKG is no acute ischemic changes and labs including troponin is negative.  Her chest pains been ongoing for several days so single troponin is sufficient.  Chest x-ray is without acute disease.  She does have significant chest wall tenderness and will be treated with Tylenol and Motrin.  She is stable for discharge home and was recommended primary care follow-up and was given strict return precautions.  Independent labs interpretation:  The following labs were  independently interpreted: Within normal range  Independent visualization of imaging: - I independently visualized the following imaging with scope of interpretation limited to determining acute life threatening conditions related to emergency care: Chest x-ray, which revealed no acute disease  Consultation: - Consulted or discussed management/test interpretation w/ external professional: N/A  Consideration for admission or further workup: Patient has no emergent conditions requiring admission or further work-up at this time and is stable for discharge home with primary care follow-up  Social Determinants of health: N/A            Final Clinical Impression(s) / ED Diagnoses Final diagnoses:  Chest wall pain    Rx / DC Orders ED Discharge Orders     None         Rexford Maus, DO 09/16/22 1759

## 2022-09-16 NOTE — ED Triage Notes (Signed)
Pt arrived POV from the drs office c/o intermittent left sided CP that started 3 days ago. Pt states the pain radiates to her back sometimes and currently she is lightheaded.

## 2022-09-16 NOTE — Discharge Instructions (Signed)
You were seen in the emergency department for your chest pain.  Your workup showed no signs of heart attack or stress on your heart and your heart and lungs appear normal on your chest x-ray.  It is unclear what is causing your symptoms but you may have strained the muscles in your chest with your recent heavy lifting and you can take Tylenol and Motrin as needed for pain.  You should follow-up with your primary doctor in the next few days to have your symptoms rechecked.  You should return to the emergency department if your chest pain is worsening and it does not go away, you have severe shortness of breath, you pass out or if you have any other new or concerning symptoms.

## 2023-01-20 ENCOUNTER — Encounter: Payer: Self-pay | Admitting: Cardiovascular Disease

## 2023-01-20 ENCOUNTER — Ambulatory Visit
Payer: No Typology Code available for payment source | Attending: Cardiovascular Disease | Admitting: Cardiovascular Disease

## 2023-01-20 VITALS — BP 116/70 | HR 82 | Ht 59.0 in | Wt 95.8 lb

## 2023-01-20 DIAGNOSIS — I10 Essential (primary) hypertension: Secondary | ICD-10-CM | POA: Diagnosis not present

## 2023-01-20 DIAGNOSIS — R9431 Abnormal electrocardiogram [ECG] [EKG]: Secondary | ICD-10-CM | POA: Diagnosis not present

## 2023-01-20 DIAGNOSIS — R011 Cardiac murmur, unspecified: Secondary | ICD-10-CM | POA: Diagnosis not present

## 2023-01-20 NOTE — Progress Notes (Signed)
CARDIOLOGY CONSULT NOTE       Patient ID: Mia King MRN: [id number] DOB/AGE: 50-07-1970 50 y.o.  Referring Physician: Long Primary Physician: Debroah Baller, FNP Primary Cardiologist: New Reason for Consultation: Abnormal ECG/Chest pain     HPI:  52 y.o. with history of HTN, HLD , murmur referred for abnormal ECG and chest pain. Seen in ED 09/16/22 Patient had  intermittent sharp stabbing pain in the left side of her chest. She states that it will occasionally feel it in her back. She states that it last for seconds at a time. She states that it will come on randomly and does not seem to be associated with exertion or eating. She states that it was happening more frequently so she went to her primary doctor who recommended that she come to the emergency department. She did receive aspirin and nitro prior to arrival here and reported some improvement of the pain but states that the pain has still been recurrent. She states that she has had no recent fever or cough, lower extremity swelling, history of blood clots, recent hospitalizations or surgery, recent long travel in the car or plane or hormone use. She states that she does lift a lot of heavy boxes at work. Rx with Tylenol/ASA for muscular pain. 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 Has been here 8 years Moved from [facility] area Has Aunt/Uncle her. Husbands health is good.     ROS All other systems reviewed and negative except as noted above  Past Medical History:  Diagnosis Date   Heart murmur    Hyperlipidemia    Hypertension    Vertigo     Family History  Problem Relation Age of Onset   Hyperlipidemia Mother    Arthritis Mother    Heart attack Father    CVA Father    Healthy Sister    Healthy Daughter    Healthy Son     Social History   Socioeconomic History   Marital  status: Married    Spouse name: Not on file   Number of children: 2   Years of education: Not on file   Highest education level: Not on file  Occupational History   Not on file  Tobacco Use   Smoking status: Never   Smokeless tobacco: Never  Vaping Use   Vaping status: Never Used  Substance and Sexual Activity   Alcohol use: Yes    Comment: occ   Drug use: Never   Sexual activity: Not Currently  Other Topics Concern   Not on file  Social History Narrative   ** Merged History Encounter **       Social Determinants of Health   Financial Resource Strain: Not on file  Food Insecurity: Not on file  Transportation Needs: Not on file  Physical Activity: Not on file  Stress: Not on file  Social Connections: Not on file  Intimate Partner Violence: Not on file    History reviewed. No pertinent surgical history.    Current Outpatient Medications:    amLODipine (NORVASC) 5 MG tablet, 1 tablet Orally Once a day for 30 days, Disp: , Rfl:    ibuprofen (ADVIL) 600 MG tablet, Take 1 tablet (600 mg total) by mouth every 6 (six) hours as needed for mild pain or moderate pain., Disp: 20 tablet,  Rfl: 0    Physical Exam: Blood pressure 116/70, pulse 82, height 4\' 11"  (1.499 m), weight 95 lb 12.8 oz (43.5 kg), SpO2 99%.   Affect appropriate Healthy:  appears stated age HEENT: normal Neck supple with no adenopathy JVP normal no bruits no thyromegaly Lungs clear with no wheezing and good diaphragmatic motion Heart:  S1/S2 SEM  murmur, no rub, gallop or click PMI normal Abdomen: benighn, BS positve, no tenderness, no AAA no bruit.  No HSM or HJR Distal pulses intact with no bruits No edema Neuro non-focal Skin warm and dry No muscular weakness   Labs:   Lab Results  Component Value Date   WBC 8.1 09/16/2022   HGB 13.8 09/16/2022   HCT 40.9 09/16/2022   MCV 82.3 09/16/2022   PLT 498 (H) 09/16/2022   No results for input(s): "NA", "K", "CL", "CO2", "BUN", "CREATININE",  "CALCIUM", "PROT", "BILITOT", "ALKPHOS", "ALT", "AST", "GLUCOSE" in the last 168 hours.  Invalid input(s): "LABALBU" No results found for: "CKTOTAL", "CKMB", "CKMBINDEX", "TROPONINI"  Lab Results  Component Value Date   CHOL 184 04/14/2020   Lab Results  Component Value Date   HDL 60.70 04/14/2020   Lab Results  Component Value Date   LDLCALC 104 (H) 04/14/2020   Lab Results  Component Value Date   TRIG 98.0 04/14/2020   Lab Results  Component Value Date   CHOLHDL 3 04/14/2020   No results found for: "LDLDIRECT"    Radiology: No results found.  EKG: SR inferior lateral T wave changes new since ECG done 04/13/22    ASSESSMENT AND PLAN:   Abnormal ECG:  labile T waves with ? LVH in inferior lateal leads Echo to r/o LVH or HOCM. Shared decision making risk stratification for CAD  Ex stress echo  2.  HTN  continue norvasc  3.  Murmur benign 1/6 SEM can screen AV during stress echo   Ex stress echo  F/U PRN    Signed: Charlton Haws 01/20/2023, 9:54 AM

## 2023-01-20 NOTE — Patient Instructions (Signed)
Medication Instructions:  Your physician recommends that you continue on your current medications as directed. Please refer to the Current Medication list given to you today.  *If you need a refill on your cardiac medications before your next appointment, please call your pharmacy*  Lab Work: If you have labs (blood work) drawn today and your tests are completely normal, you will receive your results only by: [place] (if you have MyChart) OR A paper copy in the mail If you have any lab test that is abnormal or we need to change your treatment, we will call you to review the results.  Testing/Procedures: Your physician has requested that you have a stress echocardiogram. For further information please visit [email or web address]. Please follow instruction sheet as given.  Follow-Up: At [facility], you and your health needs are our priority.  As part of our continuing mission to provide you with exceptional heart care, we have created designated Provider Care Teams.  These Care Teams include your primary Cardiologist (physician) and Advanced Practice Providers (APPs -  Physician Assistants and Nurse Practitioners) who all work together to provide you with the care you need, when you need it.  We recommend signing up for the patient portal called "MyChart".  Sign up information is provided on this After Visit Summary.  MyChart is used to connect with patients for Virtual Visits (Telemedicine).  Patients are able to view lab/test results, encounter notes, upcoming appointments, etc.  Non-urgent messages can be sent to your provider as well.   To learn more about what you can do with MyChart, go to [email or web address].    Your next appointment:   As needed  Provider:   Jenkins Rouge, MD

## 2023-02-03 ENCOUNTER — Telehealth (HOSPITAL_COMMUNITY): Payer: Self-pay | Admitting: *Deleted

## 2023-02-03 NOTE — Telephone Encounter (Signed)
Pt reached and given instructions for stress echo.

## 2023-02-10 ENCOUNTER — Ambulatory Visit (HOSPITAL_COMMUNITY): Payer: No Typology Code available for payment source

## 2023-02-10 ENCOUNTER — Ambulatory Visit (HOSPITAL_COMMUNITY): Payer: No Typology Code available for payment source | Attending: Cardiovascular Disease

## 2023-02-10 DIAGNOSIS — I10 Essential (primary) hypertension: Secondary | ICD-10-CM | POA: Diagnosis not present

## 2023-02-10 DIAGNOSIS — R9431 Abnormal electrocardiogram [ECG] [EKG]: Secondary | ICD-10-CM | POA: Insufficient documentation

## 2023-02-10 LAB — ECHOCARDIOGRAM STRESS TEST
Area-P 1/2: 4.72 cm2
S' Lateral: 2.3 cm

## 2023-02-10 MED ORDER — PERFLUTREN LIPID MICROSPHERE
1.0000 mL | INTRAVENOUS | Status: AC | PRN
Start: 2023-02-10 — End: 2023-02-10
  Administered 2023-02-10 (×2): 2 mL via INTRAVENOUS

## 2023-02-11 ENCOUNTER — Other Ambulatory Visit (HOSPITAL_COMMUNITY): Payer: Self-pay | Admitting: Cardiovascular Disease

## 2023-02-11 ENCOUNTER — Other Ambulatory Visit (HOSPITAL_COMMUNITY): Payer: Self-pay | Admitting: Emergency Medicine

## 2023-02-11 DIAGNOSIS — R931 Abnormal findings on diagnostic imaging of heart and coronary circulation: Secondary | ICD-10-CM

## 2023-02-11 DIAGNOSIS — R079 Chest pain, unspecified: Secondary | ICD-10-CM

## 2023-02-11 MED ORDER — METOPROLOL TARTRATE 50 MG PO TABS
50.0000 mg | ORAL_TABLET | Freq: Once | ORAL | 0 refills | Status: AC
Start: 2023-02-11 — End: 2023-02-11

## 2023-02-23 ENCOUNTER — Telehealth (HOSPITAL_COMMUNITY): Payer: Self-pay | Admitting: *Deleted

## 2023-02-23 ENCOUNTER — Encounter (HOSPITAL_COMMUNITY): Payer: Self-pay

## 2023-02-23 NOTE — Telephone Encounter (Signed)
Patient call about her upcoming cardiac imaging study; pt verbalizes understanding of appt date/time, parking situation and where to check in, pre-test NPO status and medications ordered, and verified current allergies; name and call back number provided for further questions should they arise  Larey Brick RN Navigator Cardiac Imaging [facility] [phone] office [phone] cell  Patient to hold her amlodipine and take 50mg  metoprolol tartrate two hours prior to her cardiac CT scan. She is aware to arrive at 9am.

## 2023-02-25 ENCOUNTER — Ambulatory Visit (HOSPITAL_COMMUNITY)
Admission: RE | Admit: 2023-02-25 | Discharge: 2023-02-25 | Disposition: A | Discharge status: Home / Self Care | Payer: No Typology Code available for payment source | Source: Ambulatory Visit | Attending: Cardiovascular Disease | Admitting: Cardiovascular Disease

## 2023-02-25 DIAGNOSIS — R931 Abnormal findings on diagnostic imaging of heart and coronary circulation: Secondary | ICD-10-CM | POA: Insufficient documentation

## 2023-02-25 DIAGNOSIS — R9431 Abnormal electrocardiogram [ECG] [EKG]: Secondary | ICD-10-CM | POA: Diagnosis not present

## 2023-02-25 MED ORDER — IOHEXOL 350 MG/ML SOLN
100.0000 mL | Freq: Once | INTRAVENOUS | Status: AC | PRN
  Administered 2023-02-25: 100 mL via INTRAVENOUS

## 2023-02-25 MED ORDER — NITROGLYCERIN 0.4 MG SL SUBL
SUBLINGUAL_TABLET | SUBLINGUAL | Status: AC
  Filled 2023-02-25: qty 2

## 2023-02-25 MED ORDER — NITROGLYCERIN 0.4 MG SL SUBL
0.8000 mg | SUBLINGUAL_TABLET | Freq: Once | SUBLINGUAL | Status: AC
  Administered 2023-02-25: 0.8 mg via SUBLINGUAL

## 2023-02-28 ENCOUNTER — Telehealth: Payer: Self-pay | Admitting: Cardiovascular Disease

## 2023-02-28 NOTE — Telephone Encounter (Signed)
Patient called stating she is giving Dr. Eden Emms permission to speak with Dr. Carolyne Littles [phone]) at [facility] regarding her test results.  Patient stated Dr. Joseph Art is her uncle.

## 2023-03-01 NOTE — Telephone Encounter (Signed)
Called patient back about her message. Informed her that her test was normal and that there would not be anything to really discuss with Dr. Joseph Art. Patient is aware of this, but still would like him to call. Informed her that I could call her uncle. Patient stated no, that she would like Dr. Eden Emms to call him. Will forward Dr. Eden Emms the number.

## 2023-03-16 ENCOUNTER — Other Ambulatory Visit: Payer: Self-pay

## 2023-03-16 ENCOUNTER — Emergency Department (HOSPITAL_COMMUNITY)
Admission: EM | Admit: 2023-03-16 | Discharge: 2023-03-17 | Disposition: A | Discharge status: Home / Self Care | Payer: No Typology Code available for payment source | Attending: Emergency Medicine | Admitting: Emergency Medicine

## 2023-03-16 DIAGNOSIS — L239 Allergic contact dermatitis, unspecified cause: Secondary | ICD-10-CM | POA: Diagnosis not present

## 2023-03-16 DIAGNOSIS — Z79899 Other long term (current) drug therapy: Secondary | ICD-10-CM | POA: Diagnosis not present

## 2023-03-16 DIAGNOSIS — H938X1 Other specified disorders of right ear: Secondary | ICD-10-CM | POA: Diagnosis present

## 2023-03-16 NOTE — ED Triage Notes (Signed)
Patient sudden onset swelling at right outer ear with redness/blister , denies injury/no hearing loss.

## 2023-03-17 DIAGNOSIS — L239 Allergic contact dermatitis, unspecified cause: Secondary | ICD-10-CM | POA: Diagnosis not present

## 2023-03-17 MED ORDER — TRIAMCINOLONE ACETONIDE 0.025 % EX OINT: 1.0000 | TOPICAL_OINTMENT | Freq: Two times a day (BID) | CUTANEOUS | 0 refills | Status: AC

## 2023-03-17 MED ORDER — DOXYCYCLINE HYCLATE 100 MG PO TABS
100.0000 mg | ORAL_TABLET | Freq: Once | ORAL | Status: AC
  Administered 2023-03-17: 100 mg via ORAL
  Filled 2023-03-17: qty 1

## 2023-03-17 MED ORDER — PREDNISONE 20 MG PO TABS
60.0000 mg | ORAL_TABLET | Freq: Once | ORAL | Status: AC
  Administered 2023-03-17: 60 mg via ORAL
  Filled 2023-03-17: qty 3

## 2023-03-17 MED ORDER — DOXYCYCLINE HYCLATE 100 MG PO CAPS: 100.0000 mg | ORAL_CAPSULE | Freq: Two times a day (BID) | ORAL | 0 refills | Status: AC

## 2023-03-17 NOTE — ED Provider Notes (Signed)
[place] EMERGENCY DEPARTMENT AT [facility] Provider Note   CSN: [id number] Arrival date & time: 03/16/23  2032     History  Chief Complaint  Patient presents with   Abscess    Mia King is a 52 y.o. female.  Presents to the emergency department for stairs on the top of the right ear.  Symptoms began earlier.  Patient reports that the ear is itchy, feels swollen and hot.  She has not used any new skin or hair products.       Home Medications Prior to Admission medications   Medication Sig Start Date End Date Taking? Authorizing Provider  doxycycline (VIBRAMYCIN) 100 MG capsule Take 1 capsule (100 mg total) by mouth 2 (two) times daily. 03/17/23  Yes Brycen Bean, Canary Brim, MD  triamcinolone (KENALOG) 0.025 % ointment Apply 1 Application topically 2 (two) times daily. 03/17/23  Yes Sierah Lacewell, Canary Brim, MD  amLODipine (NORVASC) 5 MG tablet 1 tablet Orally Once a day for 30 days 01/18/23   [provider]  ibuprofen (ADVIL) 600 MG tablet Take 1 tablet (600 mg total) by mouth every 6 (six) hours as needed for mild pain or moderate pain. 06/18/20   Fayrene Helper, PA-C  metoprolol tartrate (LOPRESSOR) 50 MG tablet Take 1 tablet (50 mg total) by mouth once for 1 dose. Take 90-120 minutes prior to scan. Hold for SBP less than 110. 02/11/23 02/11/23  Wendall Stade, MD      Allergies    Egg-derived products and Other    Review of Systems   Review of Systems  Physical Exam Updated Vital Signs BP (!) 156/76 (BP Location: Right Arm)   Pulse 100   Temp 97.7 F (36.5 C)   Resp 16   SpO2 100%  Physical Exam Vitals and nursing note reviewed.  Constitutional:      Appearance: Normal appearance.  HENT:     Head: Atraumatic.     Mouth/Throat:     Pharynx: Oropharynx is clear.  Eyes:     Pupils: Pupils are equal, round, and reactive to light.  Pulmonary:     Effort: Pulmonary effort is normal.  Skin:    Findings: Rash (Scattered clear fluid-filled  blisters on superior helix, right swelling of ear with erythema and warmth) present.  Neurological:     Mental Status: She is alert.     ED Results / Procedures / Treatments   Labs (all labs ordered are listed, but only abnormal results are displayed) Labs Reviewed - No data to display  EKG None  Radiology No results found.  Procedures Procedures    Medications Ordered in ED Medications  doxycycline (VIBRA-TABS) tablet 100 mg (has no administration in time range)  predniSONE (DELTASONE) tablet 60 mg (has no administration in time range)    ED Course/ Medical Decision Making/ A&P                                 Medical Decision Making  Differential diagnosis considered includes, but not limited to: Cellulitis; contact dermatitis  Examination of the area does reveal some erythema, warmth and mild swelling diffusely.  The superior portion of the helix has blisters and some yellow crusting.  Presentation seems consistent with contact dermatitis, as the entire area is itchy.  Cannot rule out impetigo, will treat for both.        Final Clinical Impression(s) / ED Diagnoses Final diagnoses:  Allergic  contact dermatitis, unspecified trigger    Rx / DC Orders ED Discharge Orders          Ordered    triamcinolone (KENALOG) 0.025 % ointment  2 times daily        03/17/23 0219    doxycycline (VIBRAMYCIN) 100 MG capsule  2 times daily        03/17/23 0219              Gilda Crease, MD 03/17/23 [phone]

## 2023-09-30 ENCOUNTER — Telehealth: Payer: Self-pay

## 2023-09-30 NOTE — Telephone Encounter (Signed)
   Pre-operative Risk Assessment    Patient Name: Mia King  DOB: [birth year] MRN: [id number]   Date of last office visit: 01/20/23 Janelle Mediate, MD Date of next office visit: NONE  Request for Surgical Clearance    Procedure:  Dental Extraction - Amount of Teeth to be Pulled:  4 TEETH AND ROUTINE DENTAL CARE AND FILLINGS.  Date of Surgery:  Clearance TBD                                Surgeon:  DR. Merlyn Starring Surgeon's Group or Practice Name:  [facility]  Phone number:  [phone] Fax number:  [phone]   Type of Clearance Requested:   - Medical    Type of Anesthesia:  Local    Additional requests/questions:    Signed, Collin Deal   09/30/2023, 11:59 AM

## 2023-09-30 NOTE — Telephone Encounter (Signed)
   Name: Mia King  DOB: Dec 16, 1970  MRN: [id number]  Primary Cardiologist: Janelle Mediate, MD   Preoperative team, please contact this patient and set up a phone call appointment for further preoperative risk assessment. Please obtain consent and complete medication review. Thank you for your help.  I confirm that guidance regarding antiplatelet and oral anticoagulation therapy has been completed and, if necessary, noted below.  No medications to be held  SBE prophylaxis is not required for the patient from a cardiac standpoint.  I also confirmed the patient resides in the state of [place] . As per [facility] Medical Board telemedicine laws, the patient must reside in the state in which the provider is licensed.   Ava Boatman, NP 09/30/2023, 2:11 PM  [facility]  .hcpod

## 2023-09-30 NOTE — Telephone Encounter (Signed)
 Pt scheduled for Tele Preop appt 10/07/23. Med Rec and consent done.

## 2023-09-30 NOTE — Telephone Encounter (Signed)
 Pt scheduled for Tele Preop appt 10/07/23. Med Rec and consent done.    Patient Consent for Virtual Visit        Mia King has provided verbal consent on 09/30/2023 for a virtual visit (video or telephone).   CONSENT FOR VIRTUAL VISIT FOR:  Mia King  By participating in this virtual visit I agree to the following:  I hereby voluntarily request, consent and authorize [facility] and its employed or contracted physicians, physician assistants, nurse practitioners or other licensed health care professionals (the Practitioner), to provide me with telemedicine health care services (the "Services") as deemed necessary by the treating Practitioner. I acknowledge and consent to receive the Services by the Practitioner via telemedicine. I understand that the telemedicine visit will involve communicating with the Practitioner through live audiovisual communication technology and the disclosure of certain medical information by electronic transmission. I acknowledge that I have been given the opportunity to request an in-person assessment or other available alternative prior to the telemedicine visit and am voluntarily participating in the telemedicine visit.  I understand that I have the right to withhold or withdraw my consent to the use of telemedicine in the course of my care at any time, without affecting my right to future care or treatment, and that the Practitioner or I may terminate the telemedicine visit at any time. I understand that I have the right to inspect all information obtained and/or recorded in the course of the telemedicine visit and may receive copies of available information for a reasonable fee.  I understand that some of the potential risks of receiving the Services via telemedicine include:  Delay or interruption in medical evaluation due to technological equipment failure or disruption; Information transmitted may not be sufficient (e.g. poor resolution of images)  to allow for appropriate medical decision making by the Practitioner; and/or  In rare instances, security protocols could fail, causing a breach of personal health information.  Furthermore, I acknowledge that it is my responsibility to provide information about my medical history, conditions and care that is complete and accurate to the best of my ability. I acknowledge that Practitioner's advice, recommendations, and/or decision may be based on factors not within their control, such as incomplete or inaccurate data provided by me or distortions of diagnostic images or specimens that may result from electronic transmissions. I understand that the practice of medicine is not an exact science and that Practitioner makes no warranties or guarantees regarding treatment outcomes. I acknowledge that a copy of this consent can be made available to me via my patient portal [facility] MyChart), or I can request a printed copy by calling the office of [facility].    I understand that my insurance will be billed for this visit.   I have read or had this consent read to me. I understand the contents of this consent, which adequately explains the benefits and risks of the Services being provided via telemedicine.  I have been provided ample opportunity to ask questions regarding this consent and the Services and have had my questions answered to my satisfaction. I give my informed consent for the services to be provided through the use of telemedicine in my medical care

## 2023-10-07 ENCOUNTER — Ambulatory Visit: Attending: Cardiology | Admitting: Student

## 2023-10-07 DIAGNOSIS — Z01810 Encounter for preprocedural cardiovascular examination: Secondary | ICD-10-CM

## 2023-10-07 NOTE — Progress Notes (Signed)
 Virtual Visit via Telephone Note   Because of Mia King's co-morbid illnesses, she is at least at moderate risk for complications without adequate follow up.  This format is felt to be most appropriate for this patient at this time.  The patient did not have access to video technology/had technical difficulties with video requiring transitioning to audio format only (telephone).  All issues noted in this document were discussed and addressed.  No physical exam could be performed with this format.  Please refer to the patient's chart for her consent to telehealth for [facility].  Evaluation Performed:  Preoperative cardiovascular risk assessment _____________   Date:  10/07/2023   Patient ID:  Mia King, DOB 08-18-1970, MRN [id number] Patient Location:  Home Provider location:   Office  Primary Care Provider:  Abelino Hof, FNP Primary Cardiologist:  Mia Mediate, MD  Chief Complaint / Patient Profile   53 y.o. y/o female with a h/o cardiac murmur, hypertension who is pending extraction of 4 teeth with routine dental care and fillings by Mia King and presents today for telephonic preoperative cardiovascular risk assessment.  History of Present Illness    Mia King is a 53 y.o. female who presents via audio/video conferencing for a telehealth visit today.  Pt was last seen in cardiology clinic on 01/20/2023 by Dr. Stann Earnest.  At that time Mia King complained of intermittent chest pain.  She underwent stress echo which was positive for ischemia.  She was further evaluated by coronary CTA which showed a calcium score of 0 and no evidence of CAD.  The patient is now pending procedure as outlined above. Since her last visit, she is doing well. Patient denies shortness of breath, dyspnea on exertion, lower extremity edema, orthopnea or PND. No chest pain, pressure, or tightness. No palpitations.  She does not participate in a regular exercise routine. She is  active in her job at [employer] and is able to perform light to moderate household chores.   Past Medical History    Past Medical History:  Diagnosis Date   Heart murmur    Hyperlipidemia    Hypertension    Vertigo    No past surgical history on file.  Allergies  Allergies  Allergen Reactions   Egg-Derived Products Hives   Other Hives    Chicken and Seafood    Home Medications    Prior to Admission medications   Medication Sig Start Date End Date Taking? Authorizing Provider  amLODipine (NORVASC) 5 MG tablet 1 tablet Orally Once a day for 30 days 01/18/23   [provider]  doxycycline  (VIBRAMYCIN ) 100 MG capsule Take 1 capsule (100 mg total) by mouth 2 (two) times daily. Patient not taking: Reported on 09/30/2023 03/17/23   Ballard Bongo, MD  ibuprofen  (ADVIL ) 600 MG tablet Take 1 tablet (600 mg total) by mouth every 6 (six) hours as needed for mild pain or moderate pain. Patient not taking: Reported on 09/30/2023 06/18/20   Debbra Fairy, PA-C  metoprolol  tartrate (LOPRESSOR ) 50 MG tablet Take 1 tablet (50 mg total) by mouth once for 1 dose. Take 90-120 minutes prior to scan. Hold for SBP less than 110. 02/11/23 02/11/23  Loyde Rule, MD  triamcinolone  (KENALOG ) 0.025 % ointment Apply 1 Application topically 2 (two) times daily. Patient not taking: Reported on 09/30/2023 03/17/23   Ballard Bongo, MD    Physical Exam    Vital Signs:  Mia King does not have vital signs available for review  today.  Given telephonic nature of communication, physical exam is limited. AAOx3. NAD. Normal affect.  Speech and respirations are unlabored.   Assessment & Plan    Primary Cardiologist: Mia Mediate, MD  Preoperative cardiovascular risk assessment.  Extraction of 4 teeth with routine dental care and fillings by Mia King.  Chart reviewed as part of pre-operative protocol coverage. According to the RCRI, patient has a 0.4% risk of MACE. Patient reports activity  equivalent to 4.0 METS (working at [employer] requiring a lot of lifting and walking, also performs light to moderate household activities).   Given past medical history and time since last visit, based on ACC/AHA guidelines, Mia King would be at acceptable risk for the planned procedure without further cardiovascular testing.   Patient was advised that if she develops new symptoms prior to surgery to contact our office to arrange a follow-up appointment.  she verbalized understanding.  SBE prophylaxis is not required.  I will route this recommendation to the requesting party via Epic fax function.  Please call with questions.  Time:   Today, I have spent 6 minutes with the patient with telehealth technology discussing medical history, symptoms, and management plan.     Morey Ar, NP  10/07/2023, 7:56 AM

## 2023-11-04 ENCOUNTER — Encounter: Payer: Self-pay | Admitting: Podiatry

## 2023-11-04 ENCOUNTER — Ambulatory Visit: Admitting: Podiatry

## 2023-11-04 ENCOUNTER — Ambulatory Visit (INDEPENDENT_AMBULATORY_CARE_PROVIDER_SITE_OTHER): Admitting: Podiatry

## 2023-11-04 ENCOUNTER — Ambulatory Visit (INDEPENDENT_AMBULATORY_CARE_PROVIDER_SITE_OTHER)

## 2023-11-04 VITALS — Ht 59.0 in | Wt 98.0 lb

## 2023-11-04 DIAGNOSIS — M722 Plantar fascial fibromatosis: Secondary | ICD-10-CM

## 2023-11-04 MED ORDER — MELOXICAM 15 MG PO TABS: 15.0000 mg | ORAL_TABLET | Freq: Every day | ORAL | 2 refills | Status: AC

## 2023-11-04 NOTE — Patient Instructions (Signed)

## 2023-11-04 NOTE — Progress Notes (Signed)
 Subjective:   Patient ID: Mia King, female   DOB: 53 y.o.   MRN: [id number]   HPI Patient presents stating that the left foot has been hurting her for the last 3 to 4 weeks and it is slightly better but still very tender.  States that it is in the mid arch area and it is a sharp stabbing-like pain.  Patient likes to be active does not smoke   Review of Systems  All other systems reviewed and are negative.       Objective:  Physical Exam Vitals and nursing note reviewed.  Constitutional:      Appearance: She is well-developed.  Pulmonary:     Effort: Pulmonary effort is normal.  Musculoskeletal:        General: Normal range of motion.  Skin:    General: Skin is warm.  Neurological:     Mental Status: She is alert.     Neurovascular status intact muscle strength found to be adequate range of motion within normal limits with exquisite discomfort in the mid arch area left with fluid buildup and pain.  It is not as intense as it was in the last 3 weeks but still tender      Assessment:  Acute fasciitis of the mid arch left that may have been caused by some form of activity     Plan:  H&P reviewed went ahead today discussed stretching exercises physical therapy and dispensed fascial brace to lift up the arch along with ice therapy.  Wear good support shoes if it does not improve is going to require other more aggressive treatment.  Placed on Mobic 15 mg daily  X-rays indicate no signs of spur formation moderate cavus foot structure
